# Patient Record
Sex: Male | Born: 1960 | Race: White | Hispanic: No | Marital: Married | Smoking: Never smoker
Health system: Southern US, Community
[De-identification: ages and names within clinical notes are randomized; demographics above are authoritative.]

## PROBLEM LIST (undated history)

## (undated) DIAGNOSIS — J069 Acute upper respiratory infection, unspecified: Secondary | ICD-10-CM

## (undated) DIAGNOSIS — I10 Essential (primary) hypertension: Secondary | ICD-10-CM

---

## 2015-03-23 ENCOUNTER — Encounter (HOSPITAL_COMMUNITY): Payer: Self-pay

## 2015-03-23 ENCOUNTER — Emergency Department (HOSPITAL_COMMUNITY): Payer: Self-pay

## 2015-03-23 ENCOUNTER — Emergency Department (HOSPITAL_COMMUNITY)
Admission: EM | Admit: 2015-03-23 | Discharge: 2015-03-23 | Disposition: A | Discharge status: Home / Self Care | Payer: Self-pay | Attending: Emergency Medicine | Admitting: Emergency Medicine

## 2015-03-23 DIAGNOSIS — Z792 Long term (current) use of antibiotics: Secondary | ICD-10-CM | POA: Insufficient documentation

## 2015-03-23 DIAGNOSIS — Z8709 Personal history of other diseases of the respiratory system: Secondary | ICD-10-CM | POA: Insufficient documentation

## 2015-03-23 DIAGNOSIS — Z79899 Other long term (current) drug therapy: Secondary | ICD-10-CM | POA: Insufficient documentation

## 2015-03-23 DIAGNOSIS — R0602 Shortness of breath: Secondary | ICD-10-CM | POA: Insufficient documentation

## 2015-03-23 DIAGNOSIS — Z7982 Long term (current) use of aspirin: Secondary | ICD-10-CM | POA: Insufficient documentation

## 2015-03-23 DIAGNOSIS — Z8639 Personal history of other endocrine, nutritional and metabolic disease: Secondary | ICD-10-CM | POA: Insufficient documentation

## 2015-03-23 DIAGNOSIS — I10 Essential (primary) hypertension: Secondary | ICD-10-CM | POA: Insufficient documentation

## 2015-03-23 HISTORY — DX: Acute upper respiratory infection, unspecified: J06.9

## 2015-03-23 HISTORY — DX: Essential (primary) hypertension: I10

## 2015-03-23 LAB — BASIC METABOLIC PANEL
ANION GAP: 9 (ref 5–15)
BUN: 11 mg/dL (ref 6–20)
CALCIUM: 9.8 mg/dL (ref 8.9–10.3)
CO2: 26 mmol/L (ref 22–32)
Chloride: 104 mmol/L (ref 101–111)
Creatinine, Ser: 0.81 mg/dL (ref 0.61–1.24)
GFR calc non Af Amer: >60 mL/min (ref >60)
GLUCOSE: 96 mg/dL (ref 70–99)
Potassium: 3.6 mmol/L (ref 3.5–5.1)
Sodium: 139 mmol/L (ref 135–145)

## 2015-03-23 LAB — HEPATIC FUNCTION PANEL
ALT: 29 U/L (ref 17–63)
AST: 27 U/L (ref 15–41)
Albumin: 4.3 g/dL (ref 3.5–5.0)
Alkaline Phosphatase: 70 U/L (ref 38–126)
Bilirubin, Direct: 0.1 mg/dL (ref 0.1–0.5)
Indirect Bilirubin: 1.1 mg/dL — ABNORMAL HIGH (ref 0.3–0.9)
TOTAL PROTEIN: 7 g/dL (ref 6.5–8.1)
Total Bilirubin: 1.2 mg/dL (ref 0.3–1.2)

## 2015-03-23 LAB — I-STAT TROPONIN, ED
TROPONIN I, POC: 0 ng/mL (ref 0.00–0.08)
TROPONIN I, POC: 0 ng/mL (ref 0.00–0.08)

## 2015-03-23 LAB — CBC
HCT: 42.1 % (ref 39.0–52.0)
Hemoglobin: 14.8 g/dL (ref 13.0–17.0)
MCH: 30 pg (ref 26.0–34.0)
MCHC: 35.2 g/dL (ref 30.0–36.0)
MCV: 85.4 fL (ref 78.0–100.0)
PLATELETS: 239 10*3/uL (ref 150–400)
RBC: 4.93 MIL/uL (ref 4.22–5.81)
RDW: 12.1 % (ref 11.5–15.5)
WBC: 6.5 10*3/uL (ref 4.0–10.5)

## 2015-03-23 LAB — BRAIN NATRIURETIC PEPTIDE: B Natriuretic Peptide: 53 pg/mL (ref 0.0–100.0)

## 2015-03-23 MED ORDER — OMEPRAZOLE 20 MG PO CPDR: 20.0000 mg | DELAYED_RELEASE_CAPSULE | Freq: Two times a day (BID) | ORAL | Status: AC

## 2015-03-23 MED ORDER — ASPIRIN EC 81 MG PO TBEC: 81.0000 mg | DELAYED_RELEASE_TABLET | Freq: Every day | ORAL | Status: AC

## 2015-03-23 NOTE — ED Notes (Signed)
Dr. campos at bedside

## 2015-03-23 NOTE — ED Notes (Signed)
Family at bedside. 

## 2015-03-23 NOTE — ED Notes (Addendum)
Pt reports some intermittent SOB over the past week. Pt gets anxious and worked up and wants to make sure it isn't something more. Denies any cp. [facility] done that showed a Q wave and was also given an inhaler. Thinks he pulled a muscle in his left shoulder blade since last week.

## 2015-03-23 NOTE — ED Notes (Signed)
MD at bedside. 

## 2015-03-23 NOTE — ED Provider Notes (Addendum)
CSN: [id number]     Arrival date & time 03/23/15  1259 History   First MD Initiated Contact with Patient 03/23/15 1508     Chief Complaint  Patient presents with  . Shortness of Breath     HPI Patient presents to the emergency department with intermittent shortness of breath over the past week.  He denies any chest pain or chest discomfort with this.  He states sometimes he gets some tightness in his abdomen.  Many times it is worse after eating.  It is not always exertional in nature.  Currently he is without any shortness of breath.  He states that shortness of breath can last for 1-2 hours and then he will go about his normal day without any difficulty her symptoms.  This been occurring over the past week.  He was seen in urgent care yesterday was given azithromycin and albuterol inhaler without improvement in his symptoms.  He and EKG done in the physician there was concerned about the possibility of Q waves and was recommended that he come to the emergency department for evaluation.  Patient denies chest pain or chest tightness.  No neck pain or shoulder pain.  History of hyperlipidemia and hypertension.  No family history of heart disease.  Denies tobacco abuse.   Past Medical History  Diagnosis Date  . Hypertension   . URI (upper respiratory infection)    History reviewed. No pertinent past surgical history. No family history on file. History  Substance Use Topics  . Smoking status: Never Smoker   . Smokeless tobacco: Not on file  . Alcohol Use: No    Review of Systems  All other systems reviewed and are negative.     Allergies  Review of patient's allergies indicates no known allergies.  Home Medications   Prior to Admission medications   Medication Sig Start Date End Date Taking? Authorizing Provider  albuterol (PROVENTIL HFA;VENTOLIN HFA) 108 (90 BASE) MCG/ACT inhaler Inhale 1-2 puffs into the lungs every 6 (six) hours as needed for wheezing or shortness of breath.    Yes Historical Provider, MD  aspirin EC 81 MG tablet Take 1 tablet (81 mg total) by mouth daily. 03/23/15   Azalia BilisKevin Yariela Tison, MD  azithromycin (ZITHROMAX) 250 MG tablet Take 250 mg by mouth daily.   Yes Historical Provider, MD  Fish Oil-Cholecalciferol (FISH OIL + D3 PO) Take 2 tablets by mouth daily.   Yes Historical Provider, MD  lisinopril-hydrochlorothiazide (PRINZIDE,ZESTORETIC) 10-12.5 MG per tablet Take 1 tablet by mouth daily. 03/01/15  Yes Historical Provider, MD  Multiple Vitamins-Minerals (MULTIVITAMIN & MINERAL PO) Take 1 tablet by mouth daily.   Yes Historical Provider, MD  omeprazole (PRILOSEC) 20 MG capsule Take 1 capsule (20 mg total) by mouth 2 (two) times daily before a meal. 03/23/15   Azalia BilisKevin Chayden Garrelts, MD   BP 115/73 mmHg  Pulse 60  Temp(Src) 98 F (36.7 C) (Oral)  Resp 17  Ht 5\' 9"  (1.753 m)  SpO2 96% Physical Exam  Constitutional: He is oriented to person, place, and time. He appears well-developed and well-nourished.  HENT:  Head: Normocephalic and atraumatic.  Eyes: EOM are normal.  Neck: Normal range of motion.  Cardiovascular: Normal rate, regular rhythm, normal heart sounds and intact distal pulses.   Pulmonary/Chest: Effort normal and breath sounds normal. No respiratory distress.  Abdominal: Soft. He exhibits no distension. There is no tenderness.  Musculoskeletal: Normal range of motion.  Neurological: He is alert and oriented to person, place, and time.  Skin: Skin is warm and dry.  Psychiatric: He has a normal mood and affect. Judgment normal.  Nursing note and vitals reviewed.   ED Course  Procedures (including critical care time) Labs Review Labs Reviewed  HEPATIC FUNCTION PANEL - Abnormal; Notable for the following:    Indirect Bilirubin 1.1 (*)    All other components within normal limits  CBC  BASIC METABOLIC PANEL  BRAIN NATRIURETIC PEPTIDE  I-STAT TROPOININ, ED  Rosezena Sensor, ED    Imaging Review Dg Chest 2 View  03/23/2015   CLINICAL DATA:   Shortness of breath for 1 week.  Hypertension.  EXAM: CHEST  2 VIEW  COMPARISON:  None.  FINDINGS: The heart size and mediastinal contours are within normal limits. Both lungs are clear. No evidence of pleural effusion. No mass or lymphadenopathy identified. Mild thoracic spine degenerative changes noted.  IMPRESSION: No active cardiopulmonary disease.   Electronically Signed   By: Myles Rosenthal M.D.   On: 03/23/2015 14:10     EKG Interpretation   Date/Time:  Sunday Mar 23 2015 13:39:34 EDT Ventricular Rate:  60 PR Interval:  158 QRS Duration: 96 QT Interval:  380 QTC Calculation: 380 R Axis:   -17 Text Interpretation:  Normal sinus rhythm Normal ECG No previous tracing  Confirmed by Denton Lank  MD, Caryn Bee ([id number]) on 03/23/2015 2:59:17 PM      MDM   Final diagnoses:  Shortness of breath   A symptomatically at this time.  Vital signs are normal.  EKG is without ischemic changes.  Troponin 2 is negative.  Doubt PE.  Transient shortness of breath.  This seems to be worse after eating makes me wonder about the possibility of gastroesophageal reflux disease.  No pneumonia.  Labs otherwise unremarkable.  Outpatient primary care and cardiology follow-up.  Patient be placed on Prilosec.  I also think he is a candidate and would benefit from an 81 mg aspirin daily just given his risk factor profile.  Patient understands return the emergency department for new or worsening symptoms.    Azalia Bilis, MD 03/23/15 1819  Azalia Bilis, MD 04/02/15 2300

## 2016-12-19 IMAGING — CR DG CHEST 2V
2 series · 2 of 2 positions shown · non-contrast
Comparison: None.

CLINICAL DATA: Shortness of breath for 1 week.  Hypertension.

EXAM:
CHEST  2 VIEW

[chest pa]
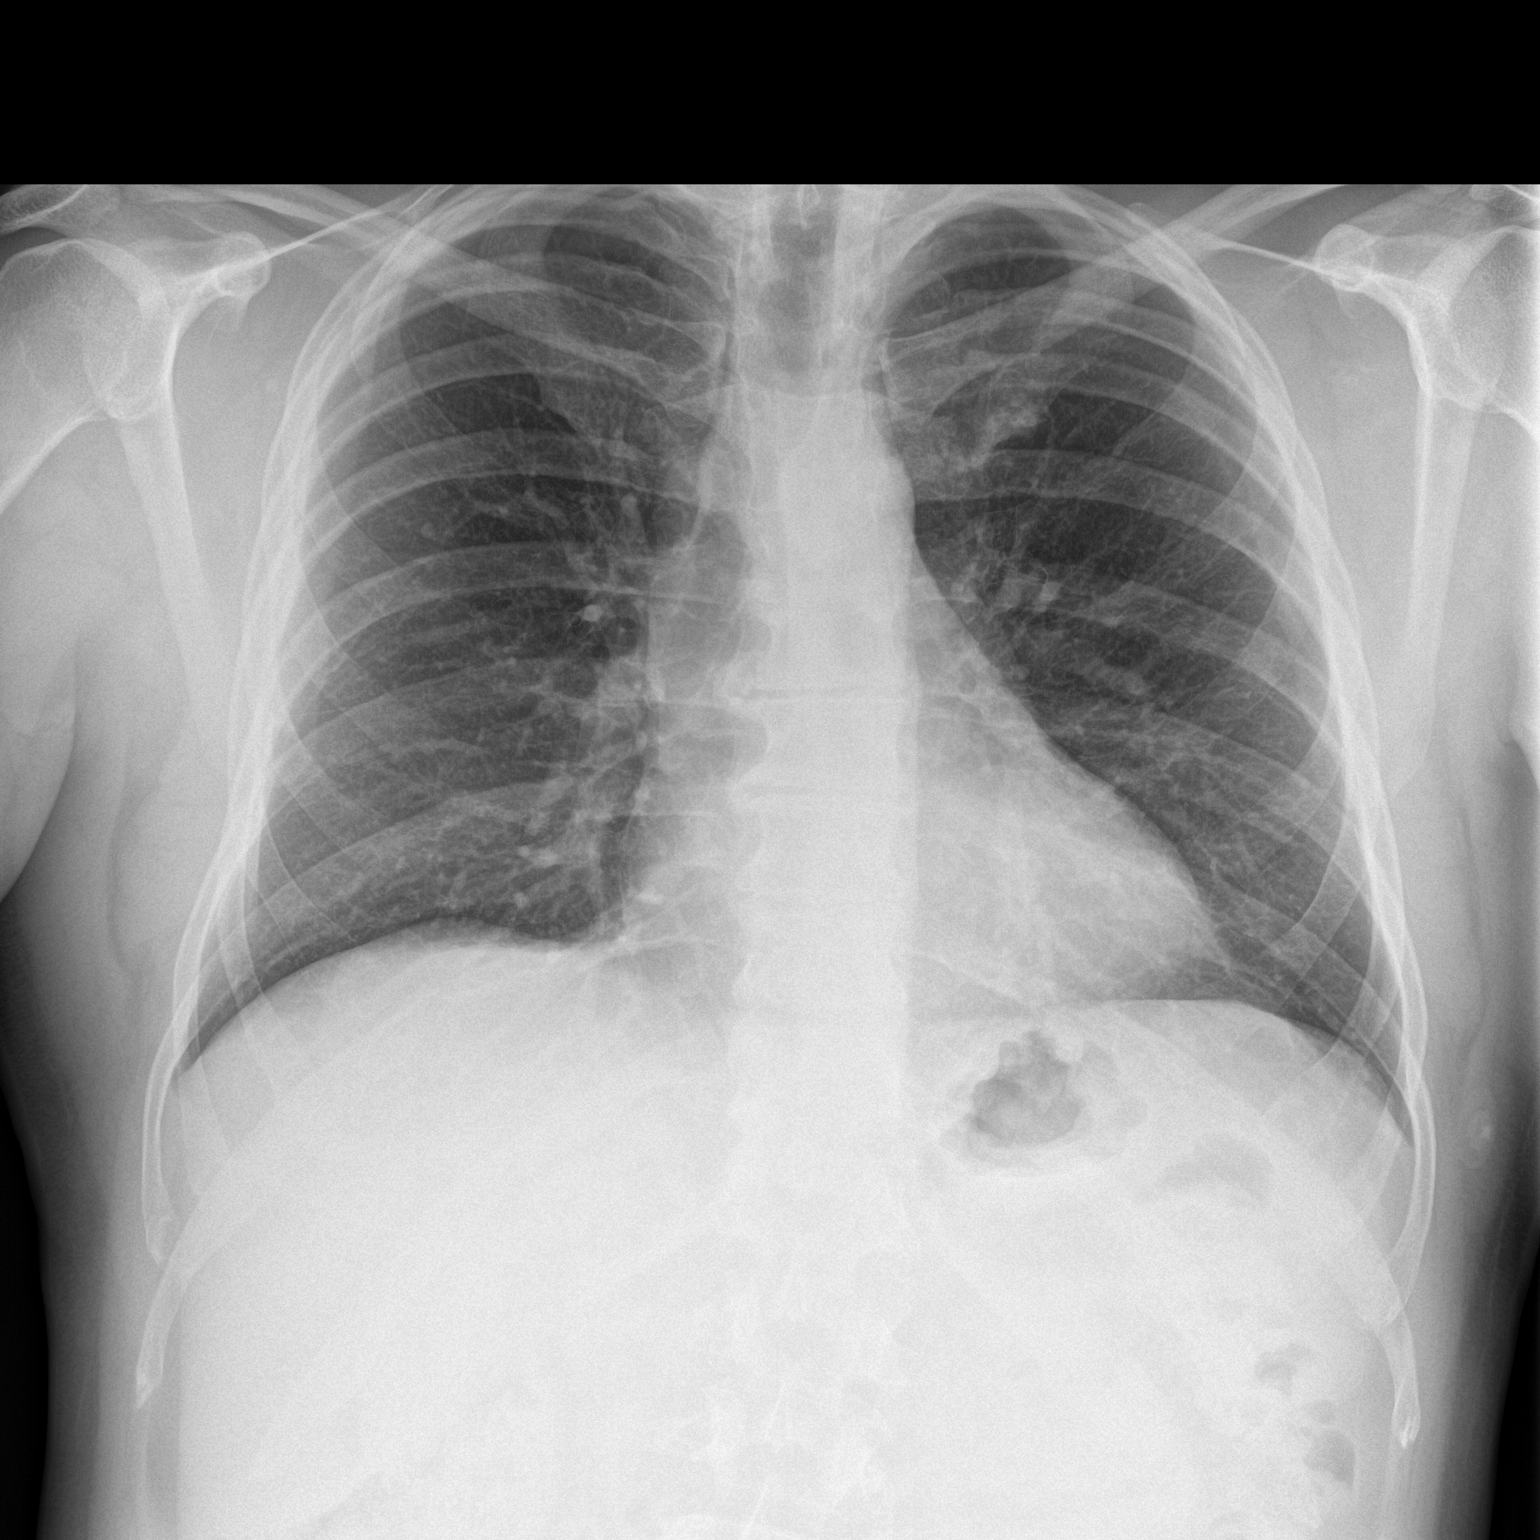

[chest lat]
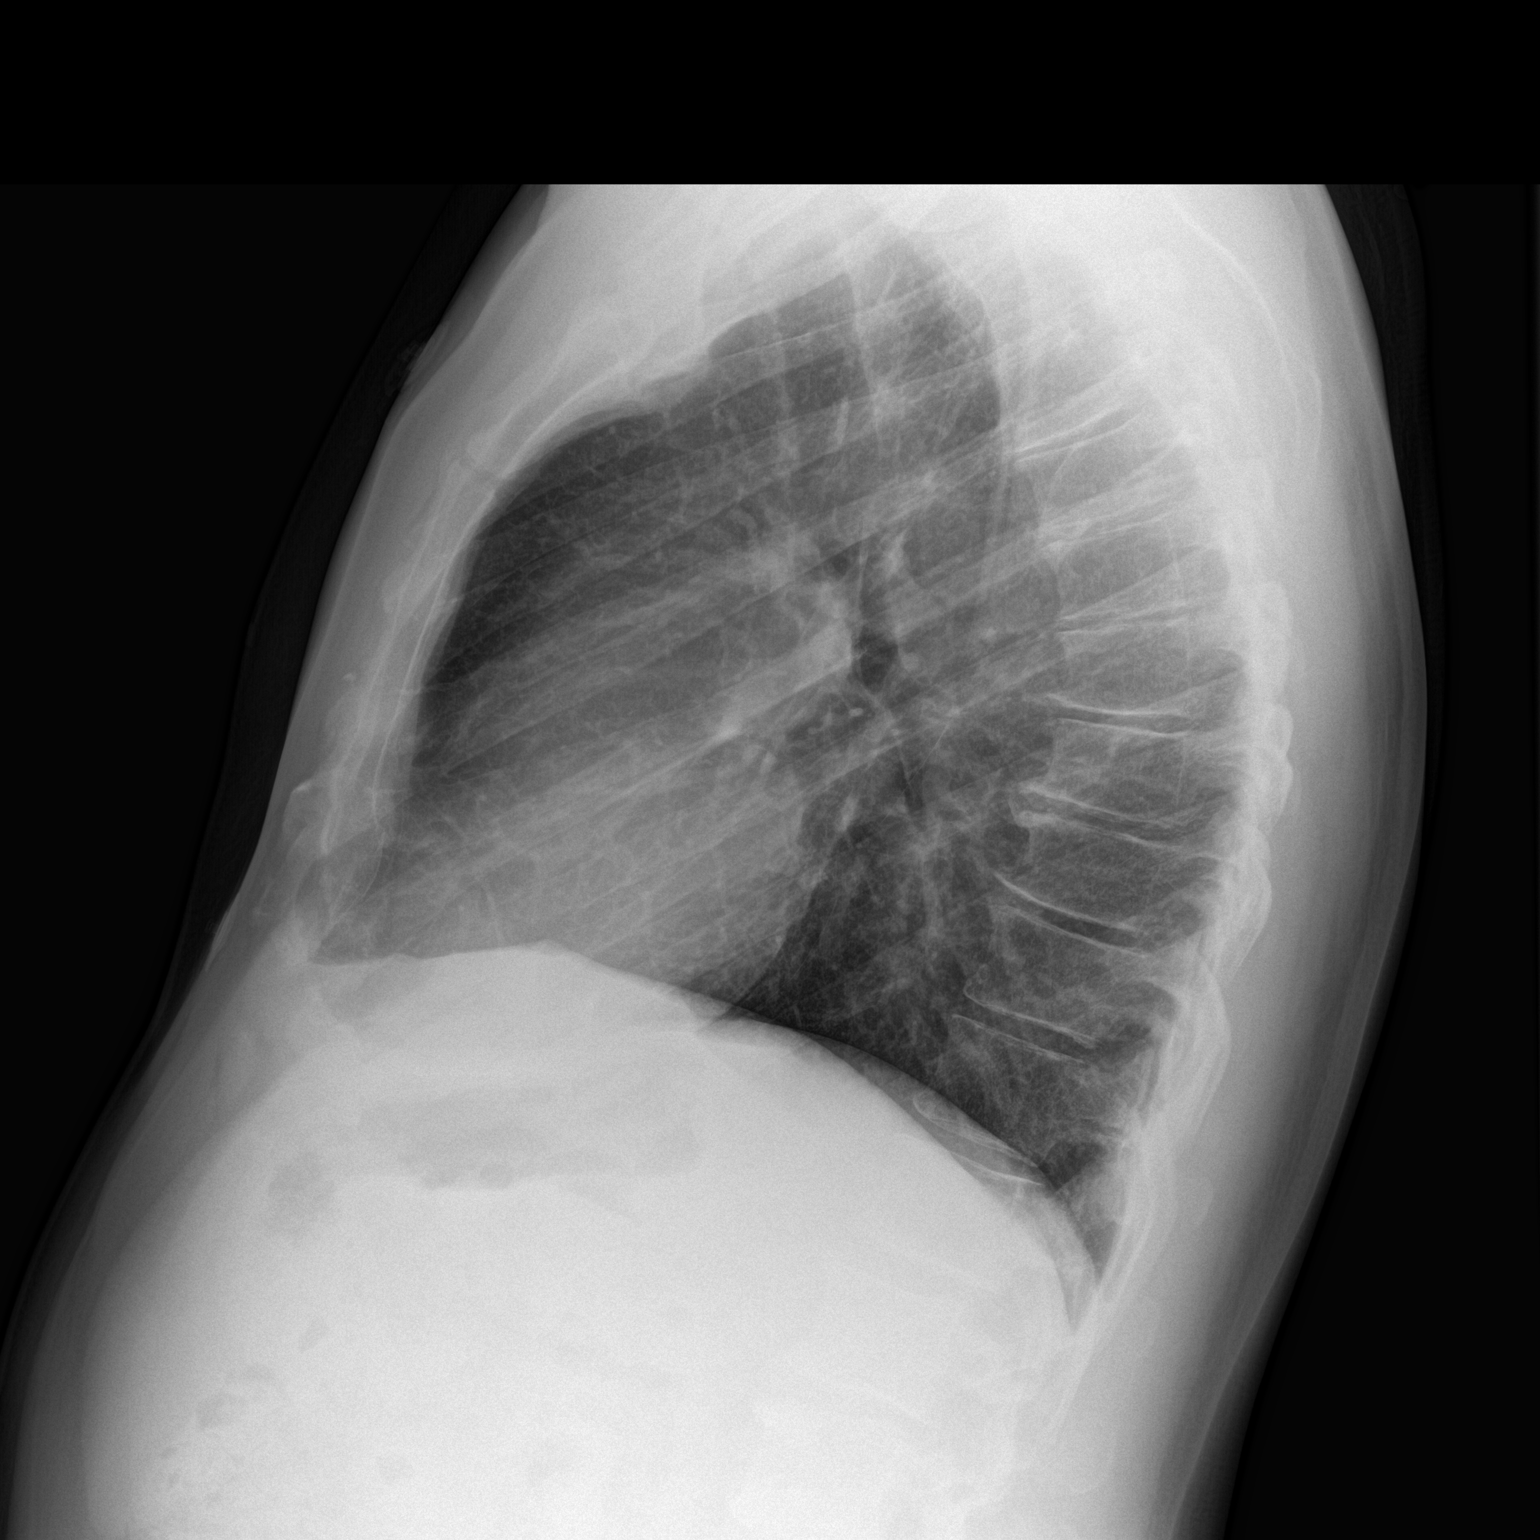

[2 of 2 positions shown; findings below may reference images not displayed]

FINDINGS: The heart size and mediastinal contours are within normal limits.
Both lungs are clear. No evidence of pleural effusion. No mass or
lymphadenopathy identified. Mild thoracic spine degenerative changes
noted.
IMPRESSION: No active cardiopulmonary disease.
